# Patient Record
Sex: Male | Born: 1985 | Race: White | Hispanic: No | Marital: Married | Smoking: Current every day smoker
Health system: Southern US, Community
[De-identification: ages and names within clinical notes are randomized; demographics above are authoritative.]

## PROBLEM LIST (undated history)

## (undated) DIAGNOSIS — S27309A Unspecified injury of lung, unspecified, initial encounter: Secondary | ICD-10-CM

---

## 2004-01-17 ENCOUNTER — Emergency Department (HOSPITAL_COMMUNITY): Admission: EM | Admit: 2004-01-17 | Discharge: 2004-01-17 | Payer: Self-pay | Admitting: Emergency Medicine

## 2005-11-24 ENCOUNTER — Emergency Department (HOSPITAL_COMMUNITY): Admission: EM | Admit: 2005-11-24 | Discharge: 2005-11-24 | Payer: Self-pay | Admitting: Emergency Medicine

## 2014-09-10 ENCOUNTER — Emergency Department (HOSPITAL_BASED_OUTPATIENT_CLINIC_OR_DEPARTMENT_OTHER)
Admission: EM | Admit: 2014-09-10 | Discharge: 2014-09-10 | Disposition: A | Discharge status: Home / Self Care | Payer: Self-pay | Attending: Emergency Medicine | Admitting: Emergency Medicine

## 2014-09-10 ENCOUNTER — Encounter (HOSPITAL_BASED_OUTPATIENT_CLINIC_OR_DEPARTMENT_OTHER): Payer: Self-pay | Admitting: Emergency Medicine

## 2014-09-10 ENCOUNTER — Emergency Department (HOSPITAL_BASED_OUTPATIENT_CLINIC_OR_DEPARTMENT_OTHER): Payer: Self-pay

## 2014-09-10 DIAGNOSIS — S0990XA Unspecified injury of head, initial encounter: Secondary | ICD-10-CM

## 2014-09-10 DIAGNOSIS — Z720 Tobacco use: Secondary | ICD-10-CM | POA: Insufficient documentation

## 2014-09-10 DIAGNOSIS — Y9289 Other specified places as the place of occurrence of the external cause: Secondary | ICD-10-CM | POA: Insufficient documentation

## 2014-09-10 DIAGNOSIS — W01198A Fall on same level from slipping, tripping and stumbling with subsequent striking against other object, initial encounter: Secondary | ICD-10-CM | POA: Insufficient documentation

## 2014-09-10 DIAGNOSIS — S060X1A Concussion with loss of consciousness of 30 minutes or less, initial encounter: Secondary | ICD-10-CM | POA: Insufficient documentation

## 2014-09-10 DIAGNOSIS — T148XXA Other injury of unspecified body region, initial encounter: Secondary | ICD-10-CM

## 2014-09-10 DIAGNOSIS — Y9389 Activity, other specified: Secondary | ICD-10-CM | POA: Insufficient documentation

## 2014-09-10 DIAGNOSIS — W19XXXA Unspecified fall, initial encounter: Secondary | ICD-10-CM

## 2014-09-10 DIAGNOSIS — S0512XA Contusion of eyeball and orbital tissues, left eye, initial encounter: Secondary | ICD-10-CM | POA: Insufficient documentation

## 2014-09-10 DIAGNOSIS — S199XXA Unspecified injury of neck, initial encounter: Secondary | ICD-10-CM | POA: Insufficient documentation

## 2014-09-10 MED ORDER — PROMETHAZINE HCL 25 MG PO TABS: 25.0000 mg | ORAL_TABLET | Freq: Four times a day (QID) | ORAL | Status: DC | PRN

## 2014-09-10 MED ORDER — NAPROXEN 500 MG PO TABS: 500.0000 mg | ORAL_TABLET | Freq: Two times a day (BID) | ORAL | Status: DC

## 2014-09-10 NOTE — ED Notes (Signed)
[place]. States he thinks he hit left side of head to ledge of tub. Bruising noted to left eye and temple. C/o neck pain and h/a. Pt states he had had a few shots of alcohol on Saturday. States he does not remember passing out. States he had a sorethroat and chills prior to passing out.

## 2014-09-10 NOTE — Discharge Instructions (Signed)
Concussion °A concussion is a brain injury. It is caused by: °· A hit to the head. °· A quick and sudden movement (jolt) of the head or neck. °A concussion is usually not life threatening. Even so, it can cause serious problems. If you had a concussion before, you may have concussion-like problems after a hit to your head. °HOME CARE °General Instructions °· Follow your doctor's directions carefully. °· Take medicines only as told by your doctor. °· Only take medicines your doctor says are safe. °· Do not drink alcohol until your doctor says it is okay. Alcohol and some drugs can slow down healing. They can also put you at risk for further injury. °· If you are having trouble remembering things, write them down. °· Try to do one thing at a time if you get distracted easily. For example, do not watch TV while making dinner. °· Talk to your family members or close friends when making important decisions. °· Follow up with your doctor as told. °· Watch your symptoms. Tell others to do the same. Serious problems can sometimes happen after a concussion. Older adults are more likely to have these problems. °· Tell your teachers, school nurse, school counselor, coach, athletic trainer, or work manager about your concussion. Tell them about what you can or cannot do. They should watch to see if: °¨ It gets even harder for you to pay attention or concentrate. °¨ It gets even harder for you to remember things or learn new things. °¨ You need more time than normal to finish things. °¨ You become annoyed (irritable) more than before. °¨ You are not able to deal with stress as well. °¨ You have more problems than before. °· Rest. Make sure you: °¨ Get plenty of sleep at night. °¨ Go to sleep early. °¨ Go to bed at the same time every day. Try to wake up at the same time. °¨ Rest during the day. °¨ Take naps when you feel tired. °· Limit activities where you have to think a lot or concentrate. These include: °¨ Doing  homework. °¨ Doing work related to a job. °¨ Watching TV. °¨ Using the computer. °Returning To Your Regular Activities °Return to your normal activities slowly, not all at once. You must give your body and brain enough time to heal.  °· Do not play sports or do other athletic activities until your doctor says it is okay. °· Ask your doctor when you can drive, ride a bicycle, or work other vehicles or machines. Never do these things if you feel dizzy. °· Ask your doctor about when you can return to work or school. °Preventing Another Concussion °It is very important to avoid another brain injury, especially before you have healed. In rare cases, another injury can lead to permanent brain damage, brain swelling, or death. The risk of this is greatest during the first 7-10 days after your injury. Avoid injuries by:  °· Wearing a seat belt when riding in a car. °· Not drinking too much alcohol. °· Avoiding activities that could lead to a second concussion (such as contact sports). °· Wearing a helmet when doing activities like: °¨ Biking. °¨ Skiing. °¨ Skateboarding. °¨ Skating. °· Making your home safer by: °¨ Removing things from the floor or stairways that could make you trip. °¨ Using grab bars in bathrooms and handrails by stairs. °¨ Placing non-slip mats on floors and in bathtubs. °¨ Improve lighting in dark areas. °GET HELP IF: °· It   gets even harder for you to pay attention or concentrate.  It gets even harder for you to remember things or learn new things.  You need more time than normal to finish things.  You become annoyed (irritable) more than before.  You are not able to deal with stress as well.  You have more problems than before.  You have problems keeping your balance.  You are not able to react quickly when you should. Get help if you have any of these problems for more than 2 weeks:   Lasting (chronic) headaches.  Dizziness or trouble balancing.  Feeling sick to your stomach  (nausea).  Seeing (vision) problems.  Being affected by noises or light more than normal.  Feeling sad, low, down in the dumps, blue, gloomy, or empty (depressed).  Mood changes (mood swings).  Feeling of fear or nervousness about what may happen (anxiety).  Feeling annoyed.  Memory problems.  Problems concentrating or paying attention.  Sleep problems.  Feeling tired all the time. GET HELP RIGHT AWAY IF:   You have bad headaches or your headaches get worse.  You have weakness (even if it is in one hand, leg, or part of the face).  You have loss of feeling (numbness).  You feel off balance.  You keep throwing up (vomiting).  You feel tired.  One black center of your eye (pupil) is larger than the other.  You twitch or shake violently (convulse).  Your speech is not clear (slurred).  You are more confused, easily angered (agitated), or annoyed than before.  You have more trouble resting than before.  You are unable to recognize people or places.  You have neck pain.  It is difficult to wake you up.  You have unusual behavior changes.  You pass out (lose consciousness). MAKE SURE YOU:   Understand these instructions.  Will watch your condition.  Will get help right away if you are not doing well or get worse. Document Released: 10/20/2009 Document Revised: 03/18/2014 Document Reviewed: 05/24/2013 [facility] Patient Information 2015 [place], [place]. This information is not intended to replace advice given to you by your health care provider. Make sure you discuss any questions you have with your health care provider.   CK and of the head face and neck without any significant abnormalities. Symptoms consistent with concussion. Take medications as directed. Work note provided. Return for any new or worse symptoms.

## 2014-09-10 NOTE — ED Provider Notes (Signed)
CSN: [id number]     Arrival date & time 09/10/14  [id number] History   First MD Initiated Contact with Patient 09/10/14 1002     Chief Complaint  Patient presents with  . Fall  . Headache     (Consider location/radiation/quality/duration/timing/severity/associated sxs/prior Treatment) Patient is a 28 y.o. male presenting with fall and headaches. The history is provided by the patient.  Fall Pertinent negatives include no chest pain, no abdominal pain, no headaches and no shortness of breath.  Headache Associated symptoms: nausea and neck pain   Associated symptoms: no abdominal pain, no back pain, no congestion, no fever and no vomiting    patient with a fall passing out in the bathroom on Saturday. Thinks he hit the left side of his head. He has bruising in that area. And bruising around his left eye. Patient also complained of some mild left-sided neck pain. Has headache. Has a little bit of mild nausea. Denies any other injuries. Patient does not remember passing out.  History reviewed. No pertinent past medical history. No past surgical history on file. No family history on file. History  Substance Use Topics  . Smoking status: Current Every Day Smoker -- 0.50 packs/day    Types: Cigarettes  . Smokeless tobacco: Not on file  . Alcohol Use: Not on file    Review of Systems  Constitutional: Negative for fever.  HENT: Negative for congestion.   Eyes: Negative for visual disturbance.  Respiratory: Negative for shortness of breath.   Cardiovascular: Negative for chest pain.  Gastrointestinal: Positive for nausea. Negative for vomiting and abdominal pain.  Genitourinary: Negative for hematuria.  Musculoskeletal: Positive for neck pain. Negative for back pain.  Skin: Positive for wound.  Neurological: Negative for headaches.  Hematological: Does not bruise/bleed easily.  Psychiatric/Behavioral: Negative for confusion.      Allergies  Review of patient's allergies indicates no  known allergies.  Home Medications   Prior to Admission medications   Medication Sig Start Date End Date Taking? Authorizing Provider  naproxen (NAPROSYN) 500 MG tablet Take 1 tablet (500 mg total) by mouth 2 (two) times daily. 09/10/14   Vanetta MuldersScott Nasira Janusz, MD  promethazine (PHENERGAN) 25 MG tablet Take 1 tablet (25 mg total) by mouth every 6 (six) hours as needed for nausea or vomiting. 09/10/14   Vanetta MuldersScott Tomasina Keasling, MD   BP 126/78  Pulse 74  Temp(Src) 98.2 F (36.8 C) (Oral)  Resp 18  Ht 6\' 1"  (1.854 m)  Wt 215 lb (97.523 kg)  BMI 28.37 kg/m2  SpO2 97% Physical Exam  Nursing note and vitals reviewed. Constitutional: He is oriented to person, place, and time. He appears well-developed and well-nourished. No distress.  HENT:  Head: Normocephalic and atraumatic.  Except for swelling around the left eye with contusion. Extraocular muscles intact. Upper lid with laceration is closed and healing.  Eyes: Conjunctivae and EOM are normal. Pupils are equal, round, and reactive to light.  Neck: Normal range of motion. Neck supple.  Cardiovascular: Normal rate, regular rhythm and normal heart sounds.   No murmur heard. Pulmonary/Chest: Effort normal and breath sounds normal. No respiratory distress.  Abdominal: Soft. Bowel sounds are normal. There is no tenderness.  Musculoskeletal: Normal range of motion. He exhibits no edema.  Neurological: He is alert and oriented to person, place, and time. No cranial nerve deficit. He exhibits normal muscle tone. Coordination normal.  Skin: Skin is warm.    ED Course  Procedures (including critical care time) Labs Review Labs Reviewed -  No data to display  Imaging Review Ct Head Wo Contrast  09/10/2014   CLINICAL DATA:  Fall, passed out in the bathroom, left eye and left temporal bruising  EXAM: CT HEAD WITHOUT CONTRAST  CT MAXILLOFACIAL WITHOUT CONTRAST  CT CERVICAL SPINE WITHOUT CONTRAST  TECHNIQUE: Multidetector CT imaging of the head, cervical  spine, and maxillofacial structures were performed using the standard protocol without intravenous contrast. Multiplanar CT image reconstructions of the cervical spine and maxillofacial structures were also generated.  COMPARISON:  None.  FINDINGS: CT HEAD FINDINGS  No skull fracture is noted. Paranasal sinuses and mastoid air cells are unremarkable.  No intracranial hemorrhage, mass effect or midline shift. No hydrocephalus. No acute infarction. No mass lesion is noted on this unenhanced scan. No intra or extra-axial fluid collection. There is mild soft tissue swelling left supraorbital an left temporal region.  CT MAXILLOFACIAL FINDINGS  Axial images shows no facial fractures. No facial fluid collection. Mild left periorbital soft tissue swelling. Mild left supraorbital soft tissue swelling. No maxillary sinuses air-fluid levels.  Coronal images shows right deviation of nasal bony septum. Bilateral semilunar canal is patent. No orbital rim or orbital floor fracture. Bilateral eye globe is symmetrical in appearance. No intraorbital hematoma.  No nasal bone fracture is identified.  Sagittal images shows no maxillary spine fracture. The nasopharyngeal and oropharyngeal airway is patent. There is prominent retropharyngeal soft tissue probable due to adenoids.  There is no mandibular fracture.  No TMJ dislocation.  No zygomatic fracture is noted.  CT CERVICAL SPINE FINDINGS  Axial images of the cervical spine shows no acute fracture or subluxation. Minimal disc space flattening with mild posterior spurring at C4-C5 level. Mild disc space flattening with mild posterior spurring at C5-C6 level. No prevertebral soft tissue swelling.  Computer processed images shows no acute fracture or subluxation. Cervical airway is patent. There is no pneumothorax in visualized lung apices.  IMPRESSION: 1. No acute intracranial abnormality. Mild soft tissue swelling left supraorbital region. 2. There is mild deviation to the right of  bony nasal septum. 3. No mandibular fracture.  No zygomatic fracture. 4. No orbital rim or orbital floor fracture. Mild soft tissue swelling left periorbital and left temporal region. 5. Patent nasopharyngeal and oropharyngeal airway. Mild prominent retropharyngeal soft tissue suspicious for adenoids. 6. No cervical spine acute fracture or subluxation. Mild degenerative changes as described above.   Electronically Signed   By: Natasha Mead M.D.   On: 09/10/2014 11:31   Ct Cervical Spine Wo Contrast  09/10/2014   CLINICAL DATA:  Fall, passed out in the bathroom, left eye and left temporal bruising  EXAM: CT HEAD WITHOUT CONTRAST  CT MAXILLOFACIAL WITHOUT CONTRAST  CT CERVICAL SPINE WITHOUT CONTRAST  TECHNIQUE: Multidetector CT imaging of the head, cervical spine, and maxillofacial structures were performed using the standard protocol without intravenous contrast. Multiplanar CT image reconstructions of the cervical spine and maxillofacial structures were also generated.  COMPARISON:  None.  FINDINGS: CT HEAD FINDINGS  No skull fracture is noted. Paranasal sinuses and mastoid air cells are unremarkable.  No intracranial hemorrhage, mass effect or midline shift. No hydrocephalus. No acute infarction. No mass lesion is noted on this unenhanced scan. No intra or extra-axial fluid collection. There is mild soft tissue swelling left supraorbital an left temporal region.  CT MAXILLOFACIAL FINDINGS  Axial images shows no facial fractures. No facial fluid collection. Mild left periorbital soft tissue swelling. Mild left supraorbital soft tissue swelling. No maxillary sinuses air-fluid levels.  Coronal images shows right deviation of nasal bony septum. Bilateral semilunar canal is patent. No orbital rim or orbital floor fracture. Bilateral eye globe is symmetrical in appearance. No intraorbital hematoma.  No nasal bone fracture is identified.  Sagittal images shows no maxillary spine fracture. The nasopharyngeal and  oropharyngeal airway is patent. There is prominent retropharyngeal soft tissue probable due to adenoids.  There is no mandibular fracture.  No TMJ dislocation.  No zygomatic fracture is noted.  CT CERVICAL SPINE FINDINGS  Axial images of the cervical spine shows no acute fracture or subluxation. Minimal disc space flattening with mild posterior spurring at C4-C5 level. Mild disc space flattening with mild posterior spurring at C5-C6 level. No prevertebral soft tissue swelling.  Computer processed images shows no acute fracture or subluxation. Cervical airway is patent. There is no pneumothorax in visualized lung apices.  IMPRESSION: 1. No acute intracranial abnormality. Mild soft tissue swelling left supraorbital region. 2. There is mild deviation to the right of bony nasal septum. 3. No mandibular fracture.  No zygomatic fracture. 4. No orbital rim or orbital floor fracture. Mild soft tissue swelling left periorbital and left temporal region. 5. Patent nasopharyngeal and oropharyngeal airway. Mild prominent retropharyngeal soft tissue suspicious for adenoids. 6. No cervical spine acute fracture or subluxation. Mild degenerative changes as described above.   Electronically Signed   By: Natasha Mead M.D.   On: 09/10/2014 11:31   Ct Maxillofacial Wo Cm  09/10/2014   CLINICAL DATA:  Fall, passed out in the bathroom, left eye and left temporal bruising  EXAM: CT HEAD WITHOUT CONTRAST  CT MAXILLOFACIAL WITHOUT CONTRAST  CT CERVICAL SPINE WITHOUT CONTRAST  TECHNIQUE: Multidetector CT imaging of the head, cervical spine, and maxillofacial structures were performed using the standard protocol without intravenous contrast. Multiplanar CT image reconstructions of the cervical spine and maxillofacial structures were also generated.  COMPARISON:  None.  FINDINGS: CT HEAD FINDINGS  No skull fracture is noted. Paranasal sinuses and mastoid air cells are unremarkable.  No intracranial hemorrhage, mass effect or midline shift. No  hydrocephalus. No acute infarction. No mass lesion is noted on this unenhanced scan. No intra or extra-axial fluid collection. There is mild soft tissue swelling left supraorbital an left temporal region.  CT MAXILLOFACIAL FINDINGS  Axial images shows no facial fractures. No facial fluid collection. Mild left periorbital soft tissue swelling. Mild left supraorbital soft tissue swelling. No maxillary sinuses air-fluid levels.  Coronal images shows right deviation of nasal bony septum. Bilateral semilunar canal is patent. No orbital rim or orbital floor fracture. Bilateral eye globe is symmetrical in appearance. No intraorbital hematoma.  No nasal bone fracture is identified.  Sagittal images shows no maxillary spine fracture. The nasopharyngeal and oropharyngeal airway is patent. There is prominent retropharyngeal soft tissue probable due to adenoids.  There is no mandibular fracture.  No TMJ dislocation.  No zygomatic fracture is noted.  CT CERVICAL SPINE FINDINGS  Axial images of the cervical spine shows no acute fracture or subluxation. Minimal disc space flattening with mild posterior spurring at C4-C5 level. Mild disc space flattening with mild posterior spurring at C5-C6 level. No prevertebral soft tissue swelling.  Computer processed images shows no acute fracture or subluxation. Cervical airway is patent. There is no pneumothorax in visualized lung apices.  IMPRESSION: 1. No acute intracranial abnormality. Mild soft tissue swelling left supraorbital region. 2. There is mild deviation to the right of bony nasal septum. 3. No mandibular fracture.  No zygomatic fracture. 4.  No orbital rim or orbital floor fracture. Mild soft tissue swelling left periorbital and left temporal region. 5. Patent nasopharyngeal and oropharyngeal airway. Mild prominent retropharyngeal soft tissue suspicious for adenoids. 6. No cervical spine acute fracture or subluxation. Mild degenerative changes as described above.   Electronically  Signed   By: Natasha MeadLiviu  Pop M.D.   On: 09/10/2014 11:31     EKG Interpretation None      MDM   Final diagnoses:  Fall  Head injury, initial encounter  Concussion, with loss of consciousness of 30 minutes or less, initial encounter  Contusion    Patient with a fall on Saturday. Patient was concerned due to having bruising around the left eye and facial pain and headache and some nausea. Also some mild left-sided neck pain.  Workup here for the fall negative. Patient clinically has symptoms consistent with concussion. CT head neck and face all negative. Except for some mild deviation of the nose.  Patient reassured. Patient be treated with Naprosyn and antinausea medicine. Work note provided.    Vanetta MuldersScott Stepfanie Yott, MD 09/10/14 1228

## 2014-09-10 NOTE — ED Notes (Signed)
Pt and wife noted walking out EMS doors. Informed that they won't be able to get back in if they leave by those doors, asked if I can help them. Pt and wife state they are leaving. Pt states he feels he needs a stronger pain medication than dr. Deretha Emoryzackowski is prescribing. Pt and wife asked to wait one moment while I speak with dr. Deretha EmoryZackowski, md is in office printing off d/c instructions, work note, and prescriptions. All given to pt, pt and wife verbalize understanding of all d/c instructions and prescriptions, all questions answered. Pt refuses d/c vital signs, declines w/c, d/c amb with wife.

## 2017-10-03 ENCOUNTER — Other Ambulatory Visit: Payer: Self-pay

## 2017-10-03 ENCOUNTER — Encounter (HOSPITAL_BASED_OUTPATIENT_CLINIC_OR_DEPARTMENT_OTHER): Payer: Self-pay | Admitting: *Deleted

## 2017-10-03 ENCOUNTER — Emergency Department (HOSPITAL_BASED_OUTPATIENT_CLINIC_OR_DEPARTMENT_OTHER)
Admission: EM | Admit: 2017-10-03 | Discharge: 2017-10-03 | Disposition: A | Discharge status: Home / Self Care | Payer: Self-pay | Attending: Emergency Medicine | Admitting: Emergency Medicine

## 2017-10-03 DIAGNOSIS — F0781 Postconcussional syndrome: Secondary | ICD-10-CM | POA: Insufficient documentation

## 2017-10-03 DIAGNOSIS — R002 Palpitations: Secondary | ICD-10-CM | POA: Insufficient documentation

## 2017-10-03 DIAGNOSIS — F1290 Cannabis use, unspecified, uncomplicated: Secondary | ICD-10-CM | POA: Insufficient documentation

## 2017-10-03 DIAGNOSIS — Z79899 Other long term (current) drug therapy: Secondary | ICD-10-CM | POA: Insufficient documentation

## 2017-10-03 LAB — URINALYSIS, ROUTINE W REFLEX MICROSCOPIC
BILIRUBIN URINE: NEGATIVE
Glucose, UA: NEGATIVE mg/dL
Hgb urine dipstick: NEGATIVE
KETONES UR: NEGATIVE mg/dL
LEUKOCYTES UA: NEGATIVE
NITRITE: NEGATIVE
PH: 6 (ref 5.0–8.0)
PROTEIN: NEGATIVE mg/dL
Specific Gravity, Urine: 1.02 (ref 1.005–1.030)

## 2017-10-03 LAB — CBC WITH DIFFERENTIAL/PLATELET
BASOS ABS: 0 10*3/uL (ref 0.0–0.1)
Basophils Relative: 0 %
EOS PCT: 1 %
Eosinophils Absolute: 0.1 10*3/uL (ref 0.0–0.7)
HEMATOCRIT: 44.9 % (ref 39.0–52.0)
Hemoglobin: 14.9 g/dL (ref 13.0–17.0)
LYMPHS ABS: 1.7 10*3/uL (ref 0.7–4.0)
LYMPHS PCT: 23 %
MCH: 31 pg (ref 26.0–34.0)
MCHC: 33.2 g/dL (ref 30.0–36.0)
MCV: 93.5 fL (ref 78.0–100.0)
MONO ABS: 0.6 10*3/uL (ref 0.1–1.0)
Monocytes Relative: 8 %
NEUTROS ABS: 5.1 10*3/uL (ref 1.7–7.7)
Neutrophils Relative %: 68 %
PLATELETS: 171 10*3/uL (ref 150–400)
RBC: 4.8 MIL/uL (ref 4.22–5.81)
RDW: 13.1 % (ref 11.5–15.5)
WBC: 7.6 10*3/uL (ref 4.0–10.5)

## 2017-10-03 LAB — RAPID URINE DRUG SCREEN, HOSP PERFORMED
Amphetamines: NOT DETECTED
BARBITURATES: NOT DETECTED
BENZODIAZEPINES: NOT DETECTED
COCAINE: NOT DETECTED
Opiates: NOT DETECTED
Tetrahydrocannabinol: POSITIVE — AB

## 2017-10-03 LAB — BASIC METABOLIC PANEL
Anion gap: 5 (ref 5–15)
BUN: 18 mg/dL (ref 6–20)
CALCIUM: 9 mg/dL (ref 8.9–10.3)
CO2: 27 mmol/L (ref 22–32)
CREATININE: 1.14 mg/dL (ref 0.61–1.24)
Chloride: 103 mmol/L (ref 101–111)
GFR calc non Af Amer: >60 mL/min (ref >60)
Glucose, Bld: 109 mg/dL — ABNORMAL HIGH (ref 65–99)
Potassium: 3.5 mmol/L (ref 3.5–5.1)
Sodium: 135 mmol/L (ref 135–145)

## 2017-10-03 NOTE — ED Provider Notes (Signed)
[facility] EMERGENCY DEPARTMENT Provider Note  CSN: [id number] Arrival date & time: 10/03/17 1801  Chief Complaint(s) Muscle Pain (multiple complaints)  HPI Maudry Diegoatrick M Esters is a 31 y.o. male who presents with several days of intermittent confusion, fatigue.  The symptoms started the day after he had a syncopal episode resulting in head trauma and loss of consciousness.  He reports that the syncopal episode occurred while at a grocery store.  States that he was feeling palpitations and felt like getting fresh air.  When he turned around he feels like he stumbled, and fell.  He did report feeling the world closing in on him and feeling lightheaded just prior to passing out.  His partner reported that he was out for approximately 1-2 minutes.  Following the fall the patient with confused and slow to respond.  He has slowly improved since.  He has not had any focal weakness.  He denied any chest pain, shortness of breath.  No nausea, vomiting, diarrhea, abdominal pain.  Patient did report having flulike symptoms 2 weeks ago for 1-2 days which resolved spontaneously.  The history is provided by the patient.    Past Medical History History reviewed. No pertinent past medical history. There are no active problems to display for this patient.  Home Medication(s) Prior to Admission medications   Medication Sig Start Date End Date Taking? Authorizing Provider  naproxen (NAPROSYN) 500 MG tablet Take 1 tablet (500 mg total) by mouth 2 (two) times daily. 09/10/14   Vanetta MuldersZackowski, Scott, MD  promethazine (PHENERGAN) 25 MG tablet Take 1 tablet (25 mg total) by mouth every 6 (six) hours as needed for nausea or vomiting. 09/10/14   Vanetta MuldersZackowski, Scott, MD                                                                                                                                    Past Surgical History History reviewed. No pertinent surgical history. Family History No family history on  file.  Social History Social History   Tobacco Use  . Smoking status: Current Every Day Smoker    Packs/day: 0.50    Types: Cigarettes  . Smokeless tobacco: Never Used  Substance Use Topics  . Alcohol use: No    Frequency: Never  . Drug use: No   Allergies Patient has no known allergies.  Review of Systems Review of Systems All other systems are reviewed and are negative for acute change except as noted in the HPI  Physical Exam Vital Signs  I have reviewed the triage vital signs BP 121/74 (BP Location: Right Arm)   Pulse 65   Temp 97.7 F (36.5 C) (Oral)   Resp 18   Ht 6\' 1"  (1.854 m)   Wt 102.1 kg (225 lb)   SpO2 98%   BMI 29.69 kg/m   Physical Exam  Constitutional: He is oriented to person, place, and time. He appears well-developed and  well-nourished. No distress.  HENT:  Head: Normocephalic and atraumatic.  Nose: Nose normal.  Eyes: Conjunctivae and EOM are normal. Pupils are equal, round, and reactive to light. Right eye exhibits no discharge. Left eye exhibits no discharge. No scleral icterus.  Neck: Normal range of motion. Neck supple.  Cardiovascular: Normal rate and regular rhythm. Exam reveals no gallop and no friction rub.  No murmur heard. Pulmonary/Chest: Effort normal and breath sounds normal. No stridor. No respiratory distress. He has no rales.  Abdominal: Soft. He exhibits no distension. There is no tenderness.  Musculoskeletal: He exhibits no edema or tenderness.  Neurological: He is alert and oriented to person, place, and time.  Mental Status:  Alert and oriented to person, place, and time.  Attention and concentration normal.  Speech clear.  Recent memory is intact  Cranial Nerves:  II Visual Fields: Intact to confrontation. Visual fields intact. III, IV, VI: Pupils equal and reactive to light and near. Full eye movement without nystagmus  V Facial Sensation: Normal. No weakness of masticatory muscles  VII: No facial weakness or  asymmetry  VIII Auditory Acuity: Grossly normal  IX/X: The uvula is midline; the palate elevates symmetrically  XI: Normal sternocleidomastoid and trapezius strength  XII: The tongue is midline. No atrophy or fasciculations.   Motor System: Muscle Strength: 5/5 and symmetric in the upper and lower extremities. No pronation or drift.  Muscle Tone: Tone and muscle bulk are normal in the upper and lower extremities.   Reflexes: DTRs: 1+ and symmetrical in all four extremities. No Clonus Coordination: Intact finger-to-nose, heel-to-shin. No tremor.  Sensation: Intact to light touch, and pinprick. Negative Romberg test.  Gait: Routine and tandem gait normal.   Skin: Skin is warm and dry. No rash noted. He is not diaphoretic. No erythema.  Psychiatric: He has a normal mood and affect.  Vitals reviewed.   ED Results and Treatments Labs (all labs ordered are listed, but only abnormal results are displayed) Labs Reviewed  RAPID URINE DRUG SCREEN, HOSP PERFORMED - Abnormal; Notable for the following components:      Result Value   Tetrahydrocannabinol POSITIVE (*)    All other components within normal limits  BASIC METABOLIC PANEL - Abnormal; Notable for the following components:   Glucose, Bld 109 (*)    All other components within normal limits  URINALYSIS, ROUTINE W REFLEX MICROSCOPIC  CBC WITH DIFFERENTIAL/PLATELET                                                                                                                         EKG  EKG Interpretation  Date/Time:  Monday October 03 2017 22:21:28 EST Ventricular Rate:  60 PR Interval:    QRS Duration: 108 QT Interval:  392 QTC Calculation: 392 R Axis:   43 Text Interpretation:  Sinus rhythm ST elev, probable normal early repol pattern No significant change since last tracing Confirmed by Drema Pry [phone]) on 10/04/2017 12:10:51 AM  Radiology No results found. Pertinent labs & imaging results that were available  during my care of the patient were reviewed by me and considered in my medical decision making (see chart for details).  Medications Ordered in ED Medications - No data to display                                                                                                                                  Procedures Procedures  (including critical care time)  Medical Decision Making / ED Course I have reviewed the nursing notes for this encounter and the patient's prior records (if available in EHR or on provided paperwork).    Patient is afebrile with stable vital signs, well-appearing, well-hydrated, nontoxic.  Labs grossly reassuring.  [facility] which patient endorsed using prior to his syncopal episode.  EKG without evidence of acute ischemia, dysrhythmias, blocks, Brugada, epsilon waves, evidence of HOCM.  Patient does seem to have symptoms of postconcussive syndrome.  Neuro exam was nonfocal.  Have low suspicion for ICH.  Do not feel that advanced imaging is warranted at this time.  Recommended follow-up with concussion specialist and recommended he establish care with a primary care provider.  The patient appears reasonably screened and/or stabilized for discharge and I doubt any other medical condition or other [facility] requiring further screening, evaluation, or treatment in the ED at this time prior to discharge.  The patient is safe for discharge with strict return precautions.   Final Clinical Impression(s) / ED Diagnoses Final diagnoses:  Palpitations  Post concussion syndrome   Disposition: Discharge  Condition: Good  I have discussed the results, Dx and Tx plan with the patient who expressed understanding and agree(s) with the plan. Discharge instructions discussed at great length. The patient was given strict return precautions who verbalized understanding of the instructions. No further questions at time of discharge.    ED Discharge Orders    None        Follow Up: Lenda KelpHudnall, Shane R, MD [address] [place] [id number] [phone]  Call  For close follow up to assess for post concussive syndrome   This chart was dictated using voice recognition software.  Despite best efforts to proofread,  errors can occur which can change the documentation meaning.   Nira Connardama, Pedro Eduardo, MD 10/04/17 [phone]

## 2017-10-03 NOTE — ED Notes (Signed)
Pt seen leaving the ED waiting room to the parking lot

## 2017-10-03 NOTE — ED Notes (Signed)
Pt seen again walking out of the department, smoking

## 2017-10-03 NOTE — ED Triage Notes (Signed)
2 weeks of muscle cramps. 3 days ago he fell and hit his head on the concrete floor after he had a rapid heart beat and dizziness. Possible LOC. He was not seen at that time. Hx of anxiety. Event could have been anxiety related per pt. The next day he couldn't remember but his memory improved as the day progressed. Yesterday he had an episode of heart beating fast. Today his muscles cramp and he feels fatigue. He is in no distress at time of triage.

## 2017-10-03 NOTE — Discharge Instructions (Signed)
If you do not have a primary care physician then it is very important that you develop a relationship with one.  Please contact [facility] at [phone] for a referral to many excellent primary care physicians in the community.   ° ° °

## 2017-10-03 NOTE — ED Notes (Signed)
ED Provider at bedside. 

## 2018-10-24 ENCOUNTER — Other Ambulatory Visit: Payer: Self-pay

## 2018-10-24 ENCOUNTER — Encounter (HOSPITAL_BASED_OUTPATIENT_CLINIC_OR_DEPARTMENT_OTHER): Payer: Self-pay

## 2018-10-24 ENCOUNTER — Emergency Department (HOSPITAL_BASED_OUTPATIENT_CLINIC_OR_DEPARTMENT_OTHER)
Admission: EM | Admit: 2018-10-24 | Discharge: 2018-10-25 | Disposition: A | Discharge status: Home / Self Care | Payer: Self-pay | Attending: Emergency Medicine | Admitting: Emergency Medicine

## 2018-10-24 DIAGNOSIS — K0889 Other specified disorders of teeth and supporting structures: Secondary | ICD-10-CM | POA: Insufficient documentation

## 2018-10-24 DIAGNOSIS — Z79899 Other long term (current) drug therapy: Secondary | ICD-10-CM | POA: Insufficient documentation

## 2018-10-24 DIAGNOSIS — F17210 Nicotine dependence, cigarettes, uncomplicated: Secondary | ICD-10-CM | POA: Insufficient documentation

## 2018-10-24 MED ORDER — PENICILLIN V POTASSIUM 250 MG PO TABS
500.0000 mg | ORAL_TABLET | Freq: Once | ORAL | Status: AC
  Administered 2018-10-24: 500 mg via ORAL
  Filled 2018-10-24: qty 2

## 2018-10-24 MED ORDER — PENICILLIN V POTASSIUM 500 MG PO TABS: 500.0000 mg | ORAL_TABLET | Freq: Four times a day (QID) | ORAL | 0 refills | Status: AC

## 2018-10-24 MED ORDER — HYDROCODONE-ACETAMINOPHEN 5-325 MG PO TABS
1.0000 | ORAL_TABLET | Freq: Once | ORAL | Status: AC
  Administered 2018-10-24: 1 via ORAL
  Filled 2018-10-24: qty 1

## 2018-10-24 NOTE — ED Provider Notes (Signed)
[facility] EMERGENCY DEPARTMENT Provider Note   CSN: [id number] Arrival date & time: 10/24/18  2226     History   Chief Complaint Chief Complaint  Patient presents with  . Dental Pain    HPI Andrew Gardner is a 32 y.o. male.  The history is provided by the patient and medical records. No language interpreter was used.  Dental Pain      Andrew Gardner is a 32 y.o. male who presents to the Emergency Department complaining of persistent, gradually worsening, right-sided, lower dental pain beginning one week ago. Pt describes their pain as throbbing. Pt has been taking ibuprofen, tylenol and BC powder at home with minimal relief of pain. Called to schedule a dentist appointment, but its not for a few days. This morning, he noticed facial swelling to the right jaw. Pt denies fever, chills, difficulty breathing, difficulty swallowing.   History reviewed. No pertinent past medical history.  There are no active problems to display for this patient.   History reviewed. No pertinent surgical history.      Home Medications    Prior to Admission medications   Medication Sig Start Date End Date Taking? Authorizing Provider  naproxen (NAPROSYN) 500 MG tablet Take 1 tablet (500 mg total) by mouth 2 (two) times daily. 09/10/14   Vanetta Mulders, MD  penicillin v potassium (VEETID) 500 MG tablet Take 1 tablet (500 mg total) by mouth 4 (four) times daily for 7 days. 10/24/18 10/31/18  Renold Kozar, Chase Picket, PA-C  promethazine (PHENERGAN) 25 MG tablet Take 1 tablet (25 mg total) by mouth every 6 (six) hours as needed for nausea or vomiting. 09/10/14   Vanetta Mulders, MD    Family History No family history on file.  Social History Social History   Tobacco Use  . Smoking status: Current Every Day Smoker    Packs/day: 0.50    Types: Cigarettes  . Smokeless tobacco: Never Used  Substance Use Topics  . Alcohol use: No    Frequency: Never  . Drug use: Yes    Types:  Marijuana     Allergies   Patient has no known allergies.   Review of Systems Review of Systems  Constitutional: Negative for chills and fever.  HENT: Positive for dental problem. Negative for sore throat.   Respiratory: Negative for shortness of breath.      Physical Exam Updated Vital Signs BP 124/78 (BP Location: Left Arm)   Pulse 70   Temp 98.3 F (36.8 C) (Oral)   Resp 18   Ht 6\' 1"  (1.854 m)   Wt 90.7 kg   SpO2 100%   BMI 26.39 kg/m   Physical Exam  Constitutional: He appears well-developed and well-nourished. No distress.  HENT:  Head: Normocephalic and atraumatic.  Mouth/Throat:    Dental cavities and poor oral dentition noted. Pain along tooth as depicted in image. No abscess noted. Midline uvula. No trismus. OP moist and clear. No oropharyngeal erythema or edema. Neck supple with no tenderness. Mild right-sided facial edema.  Neck: Neck supple.  Cardiovascular: Normal rate, regular rhythm and normal heart sounds.  No murmur heard. Pulmonary/Chest: Effort normal and breath sounds normal. No respiratory distress. He has no wheezes. He has no rales.  Musculoskeletal: Normal range of motion.  Neurological: He is alert.  Skin: Skin is warm and dry.  Nursing note and vitals reviewed.    ED Treatments / Results  Labs (all labs ordered are listed, but only abnormal results are displayed)  Labs Reviewed - No data to display  EKG None  Radiology No results found.  Procedures Procedures (including critical care time)  Medications Ordered in ED Medications  HYDROcodone-acetaminophen (NORCO/VICODIN) 5-325 MG per tablet 1 tablet (has no administration in time range)  penicillin v potassium (VEETID) tablet 500 mg (has no administration in time range)     Initial Impression / Assessment and Plan / ED Course  I have reviewed the triage vital signs and the nursing notes.  Pertinent labs & imaging results that were available during my care of the patient  were reviewed by me and considered in my medical decision making (see chart for details).    Maudry Diegoatrick M Kozma is a 32 y.o. male who presents to ED for dental pain. No abscess requiring immediate incision and drainage. Patient is afebrile, non toxic appearing, and swallowing secretions well. Exam not concerning for Ludwig's angina or pharyngeal abscess. Will treat with PenVK. Has a dentist appointment scheduled and I stressed the importance of dental follow up for ultimate management of dental pain. Patient voices understanding and is agreeable to plan.   Final Clinical Impressions(s) / ED Diagnoses   Final diagnoses:  Pain, dental    ED Discharge Orders         Ordered    penicillin v potassium (VEETID) 500 MG tablet  4 times daily     10/24/18 2324           Donyale Berthold, Chase PicketJaime Pilcher, PA-C 10/24/18 2329    Tegeler, Canary Brimhristopher J, MD 10/25/18 [phone]

## 2018-10-24 NOTE — ED Triage Notes (Signed)
Pt c/o right upper and lower dental pain-NAD-steady gait

## 2018-10-24 NOTE — Discharge Instructions (Signed)
You have a dental infection. It is very important that you get evaluated by a dentist as soon as possible. Take your full course of antibiotics. Read the instructions below.  Eat a soft or liquid diet and rinse your mouth out after meals with warm water. You should see a dentist or return here at once if you have increased swelling, increased pain or uncontrolled bleeding from the site of your injury.  SEEK MEDICAL CARE IF:  You have a fever >101 If you are unable to open your mouth New or worsening symptoms develop, any additional concerns.

## 2018-10-25 NOTE — ED Notes (Signed)
Nurse went to discharge patient and informed him that he is being given a dose of pain medication and his antibiotic and a prescription for an antibiotic. Pt advised to continue tylenol and ibuprofen at home until he sees a dentist. During discharge, pt's visitor asked if he was getting a prescription for pain medication, pt states that he is not here for pain medication, that he is here for an abx, and that "if I was here for pain, they've done a terrible job because I've told everyone that my pain is a 9 out of 10." Nurse informed pt that he is being given a dose of pain medication here, and that narcotics are not typically given in the ED for dental pain, but to continue to alternate tylenol and ibuprofen at home. Pt cut nurse off and stated that he has a dentist appointment in the morning, then continued to express his displeasure with the management of his pain. Nurse apologized and wished him a good visit at his dentist appointment in the morning, and thanked him for his visit in the ED.

## 2019-01-25 ENCOUNTER — Ambulatory Visit (HOSPITAL_COMMUNITY)
Admission: EM | Admit: 2019-01-25 | Discharge: 2019-01-25 | Discharge status: Against Medical Advice | Payer: Self-pay | Attending: Family Medicine | Admitting: Family Medicine

## 2019-07-07 ENCOUNTER — Other Ambulatory Visit: Payer: Self-pay

## 2019-07-07 ENCOUNTER — Emergency Department (HOSPITAL_COMMUNITY)
Admission: EM | Admit: 2019-07-07 | Discharge: 2019-07-07 | Disposition: A | Discharge status: Home / Self Care | Payer: Self-pay | Attending: Emergency Medicine | Admitting: Emergency Medicine

## 2019-07-07 ENCOUNTER — Emergency Department (HOSPITAL_COMMUNITY): Payer: Self-pay

## 2019-07-07 ENCOUNTER — Encounter (HOSPITAL_COMMUNITY): Payer: Self-pay | Admitting: *Deleted

## 2019-07-07 DIAGNOSIS — S27321A Contusion of lung, unilateral, initial encounter: Secondary | ICD-10-CM | POA: Insufficient documentation

## 2019-07-07 DIAGNOSIS — R042 Hemoptysis: Secondary | ICD-10-CM | POA: Insufficient documentation

## 2019-07-07 DIAGNOSIS — Y929 Unspecified place or not applicable: Secondary | ICD-10-CM | POA: Insufficient documentation

## 2019-07-07 DIAGNOSIS — J939 Pneumothorax, unspecified: Secondary | ICD-10-CM | POA: Insufficient documentation

## 2019-07-07 DIAGNOSIS — Y9389 Activity, other specified: Secondary | ICD-10-CM | POA: Insufficient documentation

## 2019-07-07 DIAGNOSIS — S2241XA Multiple fractures of ribs, right side, initial encounter for closed fracture: Secondary | ICD-10-CM | POA: Insufficient documentation

## 2019-07-07 DIAGNOSIS — R109 Unspecified abdominal pain: Secondary | ICD-10-CM | POA: Insufficient documentation

## 2019-07-07 DIAGNOSIS — Y999 Unspecified external cause status: Secondary | ICD-10-CM | POA: Insufficient documentation

## 2019-07-07 DIAGNOSIS — F17210 Nicotine dependence, cigarettes, uncomplicated: Secondary | ICD-10-CM | POA: Insufficient documentation

## 2019-07-07 LAB — CBC
HCT: 45.8 % (ref 39.0–52.0)
Hemoglobin: 15.3 g/dL (ref 13.0–17.0)
MCH: 31.4 pg (ref 26.0–34.0)
MCHC: 33.4 g/dL (ref 30.0–36.0)
MCV: 94 fL (ref 80.0–100.0)
Platelets: 185 10*3/uL (ref 150–400)
RBC: 4.87 MIL/uL (ref 4.22–5.81)
RDW: 12.8 % (ref 11.5–15.5)
WBC: 9.9 10*3/uL (ref 4.0–10.5)
nRBC: 0 % (ref 0.0–0.2)

## 2019-07-07 LAB — BASIC METABOLIC PANEL
Anion gap: 13 (ref 5–15)
BUN: 13 mg/dL (ref 6–20)
CO2: 21 mmol/L — ABNORMAL LOW (ref 22–32)
Calcium: 9 mg/dL (ref 8.9–10.3)
Chloride: 106 mmol/L (ref 98–111)
Creatinine, Ser: 1.18 mg/dL (ref 0.61–1.24)
GFR calc Af Amer: >60 mL/min (ref >60)
GFR calc non Af Amer: >60 mL/min (ref >60)
Glucose, Bld: 117 mg/dL — ABNORMAL HIGH (ref 70–99)
Potassium: 3.3 mmol/L — ABNORMAL LOW (ref 3.5–5.1)
Sodium: 140 mmol/L (ref 135–145)

## 2019-07-07 LAB — ETHANOL: Alcohol, Ethyl (B): 42 mg/dL — ABNORMAL HIGH (ref <10)

## 2019-07-07 MED ORDER — IOHEXOL 300 MG/ML  SOLN
100.0000 mL | Freq: Once | INTRAMUSCULAR | Status: AC | PRN
  Administered 2019-07-07: 03:00:00 100 mL via INTRAVENOUS

## 2019-07-07 MED ORDER — HYDROCODONE-ACETAMINOPHEN 5-325 MG PO TABS: 1.0000 | ORAL_TABLET | Freq: Four times a day (QID) | ORAL | 0 refills | Status: DC | PRN

## 2019-07-07 MED ORDER — SODIUM CHLORIDE 0.9 % IV BOLUS
1000.0000 mL | Freq: Once | INTRAVENOUS | Status: AC
  Administered 2019-07-07: 03:00:00 1000 mL via INTRAVENOUS
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
## 2019-07-07 NOTE — ED Notes (Signed)
Pt states wanting to leave at this time. Dr. Stark Jock informed

## 2019-07-07 NOTE — ED Notes (Signed)
Pt states having increased pain with breathing. RR 26 at this time. Pt placed on nasal cannula 2 L for comfort. Pt sitting at bedside
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
## 2019-07-07 NOTE — ED Notes (Signed)
Patient transported to CT 

## 2019-07-07 NOTE — ED Notes (Signed)
Discharge instructions discussed with pt and wife. Pt verbalized understanding no questions at this time.

## 2019-07-07 NOTE — Discharge Instructions (Addendum)
Hydrocodone as prescribed as needed for pain.  Ice for 20 minutes every 2 hours while awake for the next 2 days.  Return to the emergency department if you develop worsening pain, high fever, difficulty breathing, or other new and concerning symptoms.

## 2019-08-27 ENCOUNTER — Encounter: Payer: Self-pay | Admitting: Internal Medicine

## 2019-08-27 ENCOUNTER — Other Ambulatory Visit: Payer: Self-pay

## 2019-08-27 ENCOUNTER — Institutional Professional Consult (permissible substitution): Payer: Self-pay | Admitting: Internal Medicine

## 2019-08-27 ENCOUNTER — Ambulatory Visit (INDEPENDENT_AMBULATORY_CARE_PROVIDER_SITE_OTHER): Payer: Self-pay | Admitting: Internal Medicine

## 2019-08-27 ENCOUNTER — Ambulatory Visit (INDEPENDENT_AMBULATORY_CARE_PROVIDER_SITE_OTHER): Payer: Self-pay

## 2019-08-27 ENCOUNTER — Encounter: Payer: Self-pay | Admitting: *Deleted

## 2019-08-27 VITALS — BP 106/70 | HR 79 | Temp 97.6°F | Ht 73.0 in | Wt 196.6 lb

## 2019-08-27 DIAGNOSIS — R9389 Abnormal findings on diagnostic imaging of other specified body structures: Secondary | ICD-10-CM

## 2019-08-27 DIAGNOSIS — J90 Pleural effusion, not elsewhere classified: Secondary | ICD-10-CM

## 2019-08-27 MED ORDER — OXYCODONE-ACETAMINOPHEN 10-325 MG PO TABS: 1.0000 | ORAL_TABLET | Freq: Four times a day (QID) | ORAL | 0 refills | Status: DC | PRN

## 2019-08-27 NOTE — Patient Instructions (Signed)
-   We will figure out place to tap fluid - Switch to motrin 800mg  with meals (2400mg  daily), stop naproxen - Check into your work's short term disability forms, I am happy to fill out whatever needs to be filled out - Work on slow deep breathing

## 2019-08-27 NOTE — Progress Notes (Deleted)
Synopsis: Referred in *** for *** by No ref. provider found  Subjective:   PATIENT ID: Andrew Gardner GENDER: male DOB: 10-Oct-1986, MRN: [id number]  No chief complaint on file.   HPI ***  ROS Positive Symptoms in bold: *** Constitutional fevers, chills, weight loss, fatigue, anorexia, malaise  Eyes decreased vision, double vision, eye irritation  Ears, Nose, Mouth, Throat sore throat, trouble swallowing, sinus congestion  Cardiovascular chest pain, paroxysmal nocturnal dyspnea, lower ext edema, palpitations   Respiratory SOB, cough, DOE, hemoptysis, wheezing  Gastrointestinal nausea, vomiting, diarrhea  Genitourinary burning with urination, trouble urinating  Musculoskeletal joint aches, joint swelling, back pain  Integumentary  rashes, skin lesions  Neurological focal weakness, focal numbness, trouble speaking, headaches  Psychiatric depression, anxiety, confusion  Endocrine polyuria, polydipsia, cold intolerance, heat intolerance  Hematologic abnormal bruising, abnormal bleeding, unexplained nose bleeds  Allergic/Immunologic recurrent infections, hives, swollen lymph nodes    No past medical history on file.   No family history on file.   No past surgical history on file.  Social History   Socioeconomic History  . Marital status: Single    Spouse name: Not on file  . Number of children: Not on file  . Years of education: Not on file  . Highest education level: Not on file  Occupational History  . Not on file  Social Needs  . Financial resource strain: Not on file  . Food insecurity    Worry: Not on file    Inability: Not on file  . Transportation needs    Medical: Not on file    Non-medical: Not on file  Tobacco Use  . Smoking status: Current Every Day Smoker    Packs/day: 0.50    Types: Cigarettes  . Smokeless tobacco: Never Used  Substance and Sexual Activity  . Alcohol use: No    Frequency: Never  . Drug use: Yes    Types: Marijuana  . Sexual  activity: Not on file  Lifestyle  . Physical activity    Days per week: Not on file    Minutes per session: Not on file  . Stress: Not on file  Relationships  . Social Herbalist on phone: Not on file    Gets together: Not on file    Attends religious service: Not on file    Active member of club or organization: Not on file    Attends meetings of clubs or organizations: Not on file    Relationship status: Not on file  . Intimate partner violence    Fear of current or ex partner: Not on file    Emotionally abused: Not on file    Physically abused: Not on file    Forced sexual activity: Not on file  Other Topics Concern  . Not on file  Social History Narrative  . Not on file     No Known Allergies   Outpatient Medications Prior to Visit  Medication Sig Dispense Refill  . HYDROcodone-acetaminophen (NORCO) 5-325 MG tablet Take 1-2 tablets by mouth every 6 (six) hours as needed. 20 tablet 0  . naproxen (NAPROSYN) 500 MG tablet Take 1 tablet (500 mg total) by mouth 2 (two) times daily. 14 tablet 0  . promethazine (PHENERGAN) 25 MG tablet Take 1 tablet (25 mg total) by mouth every 6 (six) hours as needed for nausea or vomiting. 12 tablet 1   No facility-administered medications prior to visit.      Objective:  GEN: *** HEENT:  CV: PULM: GI: EXT: NEURO: PSYCH: SKIN:    There were no vitals filed for this visit.   on *** LPM *** RA BMI Readings from Last 3 Encounters:  07/07/19 29.43 kg/m  10/24/18 26.39 kg/m  10/03/17 29.69 kg/m   Wt Readings from Last 3 Encounters:  07/07/19 220 lb (99.8 kg)  10/24/18 200 lb (90.7 kg)  10/03/17 225 lb (102.1 kg)     CBC    Component Value Date/Time   WBC 9.9 07/07/2019 0211   RBC 4.87 07/07/2019 0211   HGB 15.3 07/07/2019 0211   HCT 45.8 07/07/2019 0211   PLT 185 07/07/2019 0211   MCV 94.0 07/07/2019 0211   MCH 31.4 07/07/2019 0211   MCHC 33.4 07/07/2019 0211   RDW 12.8 07/07/2019 0211   LYMPHSABS 1.7  10/03/2017 2032   MONOABS 0.6 10/03/2017 2032   EOSABS 0.1 10/03/2017 2032   BASOSABS 0.0 10/03/2017 2032    ***  Chest Imaging: ***  Pulmonary Functions Testing Results: No flowsheet data found.  FeNO: ***  Pathology: ***  Echocardiogram: ***  Heart Catheterization: ***    Assessment & Plan:     Discussion: ***   Current Outpatient Medications:  .  HYDROcodone-acetaminophen (NORCO) 5-325 MG tablet, Take 1-2 tablets by mouth every 6 (six) hours as needed., Disp: 20 tablet, Rfl: 0 .  naproxen (NAPROSYN) 500 MG tablet, Take 1 tablet (500 mg total) by mouth 2 (two) times daily., Disp: 14 tablet, Rfl: 0 .  promethazine (PHENERGAN) 25 MG tablet, Take 1 tablet (25 mg total) by mouth every 6 (six) hours as needed for nausea or vomiting., Disp: 12 tablet, Rfl: 1   Lorin Glass, MD [facility] 08/27/2019 9:03 AM
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
## 2019-08-28 ENCOUNTER — Other Ambulatory Visit (HOSPITAL_COMMUNITY)
Admission: RE | Admit: 2019-08-28 | Discharge: 2019-08-28 | Disposition: A | Discharge status: Home / Self Care | Payer: Self-pay | Source: Ambulatory Visit | Attending: Internal Medicine | Admitting: Internal Medicine

## 2019-08-28 DIAGNOSIS — Z20828 Contact with and (suspected) exposure to other viral communicable diseases: Secondary | ICD-10-CM | POA: Insufficient documentation

## 2019-08-28 DIAGNOSIS — Z01812 Encounter for preprocedural laboratory examination: Secondary | ICD-10-CM | POA: Insufficient documentation

## 2019-08-29 LAB — NOVEL CORONAVIRUS, NAA (HOSP ORDER, SEND-OUT TO REF LAB; TAT 18-24 HRS): SARS-CoV-2, NAA: NOT DETECTED

## 2019-08-31 ENCOUNTER — Ambulatory Visit (HOSPITAL_COMMUNITY)
Admission: RE | Admit: 2019-08-31 | Discharge: 2019-08-31 | Disposition: A | Discharge status: Home / Self Care | Payer: Self-pay | Source: Ambulatory Visit | Attending: Internal Medicine | Admitting: Internal Medicine

## 2019-08-31 ENCOUNTER — Other Ambulatory Visit: Payer: Self-pay

## 2019-08-31 ENCOUNTER — Ambulatory Visit (HOSPITAL_COMMUNITY)
Admission: RE | Admit: 2019-08-31 | Discharge: 2019-08-31 | Disposition: A | Discharge status: Home / Self Care | Payer: Self-pay | Source: Ambulatory Visit | Attending: Radiology | Admitting: Radiology

## 2019-08-31 ENCOUNTER — Telehealth: Payer: Self-pay | Admitting: Pulmonary Disease

## 2019-08-31 DIAGNOSIS — F17210 Nicotine dependence, cigarettes, uncomplicated: Secondary | ICD-10-CM | POA: Insufficient documentation

## 2019-08-31 DIAGNOSIS — J90 Pleural effusion, not elsewhere classified: Secondary | ICD-10-CM | POA: Insufficient documentation

## 2019-08-31 DIAGNOSIS — Z9889 Other specified postprocedural states: Secondary | ICD-10-CM | POA: Insufficient documentation

## 2019-08-31 MED ORDER — LIDOCAINE HCL 1 % IJ SOLN
INTRAMUSCULAR | Status: AC
  Filled 2019-08-31: qty 10

## 2019-08-31 NOTE — Telephone Encounter (Signed)
Spoke with Judson Roch. Advised her that the procedure was done for therapeutic reasons, I remembered Dr. Tamala Julian talking about this the other day while in the clinic. She verbalized understanding.   Nothing further needed at time of call.

## 2019-08-31 NOTE — Procedures (Signed)
Ultrasound-guided  therapeutic right thoracentesis performed yielding 1 liter of amber fluid. Follow-up chest x-ray pending. EBL none.  Patient did experience a mild vasovagal response near end of procedure which responded to cool compress to face and neck and p.o. fluids.

## 2019-09-03 ENCOUNTER — Other Ambulatory Visit: Payer: Self-pay

## 2019-09-03 ENCOUNTER — Ambulatory Visit (INDEPENDENT_AMBULATORY_CARE_PROVIDER_SITE_OTHER): Payer: Self-pay | Admitting: Pulmonary Disease

## 2019-09-03 ENCOUNTER — Encounter: Payer: Self-pay | Admitting: Pulmonary Disease

## 2019-09-03 DIAGNOSIS — Z720 Tobacco use: Secondary | ICD-10-CM | POA: Insufficient documentation

## 2019-09-03 DIAGNOSIS — J90 Pleural effusion, not elsewhere classified: Secondary | ICD-10-CM

## 2019-09-03 DIAGNOSIS — F17200 Nicotine dependence, unspecified, uncomplicated: Secondary | ICD-10-CM

## 2019-09-03 NOTE — Assessment & Plan Note (Signed)
Plan: We recommend that you stop smoking 

## 2019-09-03 NOTE — Patient Instructions (Addendum)
You were seen today by Lauraine Rinne, NP  for:   1. Pleural effusion  - DG Chest 2 View; Future  We will repeat a chest x-ray in 2 weeks, you do not need an appointment for this risk, and then complete over the first week of November  Contact our office if you start having worsening shortness of breath or increased chest pain  We will have attended follow-up for you to see Dr. Tamala Julian in 6 weeks, if symptoms are improving as well as chest x-ray in 2 weeks is cleared then likely this appointment could be canceled and just have follow-up as needed with our office  2. Smoker  Continuing to smoke can increase your chances of having pleural diseases as well as respiratory problems in the future.  We recommend that you stop smoking.  We recommend that you stop smoking.  >>>You need to set a quit date >>>If you have friends or family who smoke, let them know you are trying to quit and not to smoke around you or in your living environment  Smoking Cessation Resources:  1 800 QUIT NOW  >>> Patient to call this resource and utilize it to help support her quit smoking >>> Keep up your hard work with stopping smoking  You can also contact the [facility] >>>For smoking cessation classes call [phone]  We do not recommend using e-cigarettes as a form of stopping smoking  You can sign up for smoking cessation support texts and information:  >>>https://smokefree.gov/smokefreetxt    We recommend today:  Orders Placed This Encounter  Procedures  . DG Chest 2 View    Standing Status:   Future    Standing Expiration Date:   11/02/2020    Order Specific Question:   Reason for Exam (SYMPTOM  OR DIAGNOSIS REQUIRED)    Answer:   follow up plueral effusion    Order Specific Question:   Preferred imaging location?    Answer:   Internal    Order Specific Question:   Radiology Contrast Protocol - do NOT remove file path    Answer:    [email or web address]   Orders Placed This Encounter  Procedures  . DG Chest 2 View   No orders of the defined types were placed in this encounter.   Follow Up:    Return in about 6 weeks (around 10/15/2019), or if symptoms worsen or fail to improve, for Follow up with Dr. Tamala Julian.   Please do your part to reduce the spread of COVID-19:      Reduce your risk of any infection  and COVID19 by using the similar precautions used for avoiding the common cold or flu:  Marland Kitchen Wash your hands often with soap and warm water for at least 20 seconds.  If soap and water are not readily available, use an alcohol-based hand sanitizer with at least 60% alcohol.  . If coughing or sneezing, cover your mouth and nose by coughing or sneezing into the elbow areas of your shirt or coat, into a tissue or into your sleeve (not your hands). Langley Gauss A MASK when in public  . Avoid shaking hands with others and consider head nods or verbal greetings only. . Avoid touching your eyes, nose, or mouth with unwashed hands.  . Avoid close contact with people who are sick. . Avoid places or events with large numbers of people in one location, like concerts or sporting events. . If you have some symptoms but not all symptoms,  continue to monitor at home and seek medical attention if your symptoms worsen. . If you are having a medical emergency, call 911.   ADDITIONAL HEALTHCARE OPTIONS FOR PATIENTS  [facility] / e-Visit: [email or web address]         [facility]: [phone]  [facility]: [phone]                   [facility]: [phone]     It is flu season:   >>> Best ways to protect herself from the flu: Receive the yearly flu vaccine, practice good hand hygiene washing with soap and also using hand sanitizer when available, eat a nutritious meals, get adequate rest, hydrate  appropriately   Please contact the office if your symptoms worsen or you have concerns that you are not improving.   Thank you for choosing [facility] for your healthcare, and for allowing [place] to partner with you on your healthcare journey. I am thankful to be able to provide care to you today.   Elisha Headland FNP-C
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
## 2019-09-03 NOTE — Assessment & Plan Note (Signed)
Plan:  Chest Xray in 2 weeks  Continue to use Mobic  Continue to use NSAIDs  Can add tylenol 500mg  BID daily to help with pain  Can use percocet if needed Follow up with our office in 6 weeks with Dr. Tamala Julian, can cancel appt if symptoms continue to improve and cxray clear.

## 2019-09-13 ENCOUNTER — Ambulatory Visit (INDEPENDENT_AMBULATORY_CARE_PROVIDER_SITE_OTHER): Payer: Self-pay | Admitting: Pulmonary Disease

## 2019-09-13 ENCOUNTER — Encounter: Payer: Self-pay | Admitting: Pulmonary Disease

## 2019-09-13 ENCOUNTER — Ambulatory Visit (INDEPENDENT_AMBULATORY_CARE_PROVIDER_SITE_OTHER): Payer: Self-pay

## 2019-09-13 ENCOUNTER — Other Ambulatory Visit: Payer: Self-pay

## 2019-09-13 VITALS — BP 104/72 | HR 72 | Temp 98.2°F | Ht 73.0 in | Wt 196.2 lb

## 2019-09-13 DIAGNOSIS — F17210 Nicotine dependence, cigarettes, uncomplicated: Secondary | ICD-10-CM

## 2019-09-13 DIAGNOSIS — F17200 Nicotine dependence, unspecified, uncomplicated: Secondary | ICD-10-CM

## 2019-09-13 DIAGNOSIS — J90 Pleural effusion, not elsewhere classified: Secondary | ICD-10-CM
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
## 2019-09-13 NOTE — Assessment & Plan Note (Addendum)
Plan: Chest x-ray today Walk in office today Continue to use Mobic Can use NSAIDs and/or Tylenol for management of pain We will not refill Percocet today Follow-up in 6 months and as needed Okay to return to work and advance exercise as tolerated

## 2019-09-13 NOTE — Patient Instructions (Addendum)
You were seen today by Coral Ceo, NP  for:   1. Pleural effusion  Walk today   - DG Chest 2 View; Future  Okay to return to work.  Work note provided today.  2. Smoker  We recommend that you stop smoking.  >>>You need to set a quit date >>>If you have friends or family who smoke, let them know you are trying to quit and not to smoke around you or in your living environment  Smoking Cessation Resources:  1 800 QUIT NOW  >>> Patient to call this resource and utilize it to help support her quit smoking >>> Keep up your hard work with stopping smoking  You can also contact the [facility] >>>For smoking cessation classes call [phone]  We do not recommend using e-cigarettes as a form of stopping smoking  You can sign up for smoking cessation support texts and information:  >>>https://smokefree.gov/smokefreetxt     We recommend today:  Orders Placed This Encounter  Procedures   DG Chest 2 View    Standing Status:   Future    Standing Expiration Date:   11/12/2020    Order Specific Question:   Reason for Exam (SYMPTOM  OR DIAGNOSIS REQUIRED)    Answer:   history of pleural effusion    Order Specific Question:   Preferred imaging location?    Answer:   Internal    Order Specific Question:   Radiology Contrast Protocol - do NOT remove file path    Answer:   [email or web address]   Orders Placed This Encounter  Procedures   DG Chest 2 View   No orders of the defined types were placed in this encounter.   Follow Up:    Return in about 1 year (around 09/12/2020), or if symptoms worsen or fail to improve, for Follow up with Dr. Katrinka Blazing.   Please do your part to reduce the spread of COVID-19:      Reduce your risk of any infection  and COVID19 by using the similar precautions used for avoiding the common cold or flu:   Wash your hands often with soap and warm water for at least 20 seconds.  If soap and water  are not readily available, use an alcohol-based hand sanitizer with at least 60% alcohol.   If coughing or sneezing, cover your mouth and nose by coughing or sneezing into the elbow areas of your shirt or coat, into a tissue or into your sleeve (not your hands).  WEAR A MASK when in public   Avoid shaking hands with others and consider head nods or verbal greetings only.  Avoid touching your eyes, nose, or mouth with unwashed hands.   Avoid close contact with people who are sick.  Avoid places or events with large numbers of people in one location, like concerts or sporting events.  If you have some symptoms but not all symptoms, continue to monitor at home and seek medical attention if your symptoms worsen.  If you are having a medical emergency, call 911.   ADDITIONAL HEALTHCARE OPTIONS FOR PATIENTS   Telehealth / e-Visit: [email or web address]         [facility]: [phone]  [facility]: [phone]                   [facility]: [phone]     It is flu season:   >>> Best ways to protect herself from the flu: Receive the  yearly flu vaccine, practice good hand hygiene washing with soap and also using hand sanitizer when available, eat a nutritious meals, get adequate rest, hydrate appropriately   Please contact the office if your symptoms worsen or you have concerns that you are not improving.   Thank you for choosing [facility] for your healthcare, and for allowing [place] to partner with you on your healthcare journey. I am thankful to be able to provide care to you today.   Wyn Quaker FNP-C      Health Risks of Smoking Smoking cigarettes is very bad for your health. Tobacco smoke has over 200 known poisons in it. It contains the poisonous gases nitrogen oxide and carbon monoxide. There are over 60 chemicals in tobacco smoke that cause cancer. Smoking is difficult  to quit because a chemical in tobacco, called nicotine, causes addiction or dependence. When you smoke and inhale, nicotine is absorbed rapidly into the bloodstream through your lungs. Both inhaled and non-inhaled nicotine may be addictive. What are the risks of cigarette smoke? Cigarette smokers have an increased risk of many serious medical problems, including:  Lung cancer.  Lung disease, such as pneumonia, bronchitis, and emphysema.  Chest pain (angina) and heart attack because the heart is not getting enough oxygen.  Heart disease and peripheral blood vessel disease.  High blood pressure (hypertension).  Stroke.  Oral cancer, including cancer of the lip, mouth, or voice box.  Bladder cancer.  Pancreatic cancer.  Cervical cancer.  Pregnancy complications, including premature birth.  Stillbirths and smaller newborn babies, birth defects, and genetic damage to sperm.  Early menopause.  Lower estrogen level for women.  Infertility.  Facial wrinkles.  Blindness.  Increased risk of broken bones (fractures).  Senile dementia.  Stomach ulcers and internal bleeding.  Delayed wound healing and increased risk of complications during surgery.  Even smoking lightly shortens your life expectancy by several years. Because of secondhand smoke exposure, children of smokers have an increased risk of the following:  Sudden infant death syndrome (SIDS).  Respiratory infections.  Lung cancer.  Heart disease.  Ear infections. What are the benefits of quitting? There are many health benefits of quitting smoking. Here are some of them:  Within days of quitting smoking, your risk of having a heart attack decreases, your blood flow improves, and your lung capacity improves. Blood pressure, pulse rate, and breathing patterns start returning to normal soon after quitting.  Within months, your lungs may clear up completely.  Quitting for 10 years reduces your risk of  developing lung cancer and heart disease to almost that of a nonsmoker.  People who quit may see an improvement in their overall quality of life. How do I quit smoking?     Smoking is an addiction with both physical and psychological effects, and longtime habits can be hard to change. Your health care provider can recommend:  Programs and community resources, which may include group support, education, or talk therapy.  Prescription medicines to help reduce cravings.  Nicotine replacement products, such as patches, gum, and nasal sprays. Use these products only as directed. Do not replace cigarette smoking with electronic cigarettes, which are commonly called e-cigarettes. The safety of e-cigarettes is not known, and some may contain harmful chemicals.  A combination of two or more of these methods. Where to find more information  American Lung Association: www.lung.org  American Cancer Society: www.cancer.org Summary  Smoking cigarettes is very bad for your health. Cigarette smokers have an increased risk  of many serious medical problems, including several cancers, heart disease, and stroke.  Smoking is an addiction with both physical and psychological effects, and longtime habits can be hard to change.  By stopping right away, you can greatly reduce the risk of medical problems for you and your family.  To help you quit smoking, your health care provider can recommend programs, community resources, prescription medicines, and nicotine replacement products such as patches, gum, and nasal sprays. This information is not intended to replace advice given to you by your health care provider. Make sure you discuss any questions you have with your health care provider. Document Released: 12/09/2004 Document Revised: 02/02/2018 Document Reviewed: 11/05/2016 Elsevier Patient Education  2020 [employer]

## 2019-09-13 NOTE — Progress Notes (Signed)
Small residual pleural effusion.  This is significantly decreased from chest x-ray prior to thoracentesis.  It has slightly decreased from the last exam on 08/31/2019 after thoracentesis.  We will continue to monitor this.  If you have worsened shortness of breath or chest pain please contact our office and schedule a follow-up visit.  Wyn Quaker, FNP

## 2019-09-13 NOTE — Assessment & Plan Note (Signed)
Plan: Emphasized need to stop smoking today Patient declined Patient declined flu vaccine

## 2019-09-17 NOTE — Progress Notes (Signed)
See previous note

## 2019-09-18 ENCOUNTER — Telehealth: Payer: Self-pay | Admitting: Internal Medicine

## 2019-09-18 ENCOUNTER — Encounter: Payer: Self-pay | Admitting: *Deleted

## 2019-09-18 NOTE — Telephone Encounter (Signed)
Notes recorded by Lauraine Rinne, NP on 09/13/2019 at 8:52 PM EDT  Small residual pleural effusion. This is significantly decreased from chest x-ray prior to thoracentesis. It has slightly decreased from the last exam on 08/31/2019 after thoracentesis.   We will continue to monitor this.   If you have worsened shortness of breath or chest pain please contact our office and schedule a follow-up visit.   Wyn Quaker, FNP ----------------------------  Spoke with pt, aware of results/recs.  Nothing further needed at this time- will close encounter.

## 2020-05-19 ENCOUNTER — Emergency Department (HOSPITAL_BASED_OUTPATIENT_CLINIC_OR_DEPARTMENT_OTHER): Payer: Self-pay

## 2020-05-19 ENCOUNTER — Other Ambulatory Visit: Payer: Self-pay

## 2020-05-19 ENCOUNTER — Encounter (HOSPITAL_BASED_OUTPATIENT_CLINIC_OR_DEPARTMENT_OTHER): Payer: Self-pay

## 2020-05-19 ENCOUNTER — Emergency Department (HOSPITAL_BASED_OUTPATIENT_CLINIC_OR_DEPARTMENT_OTHER)
Admission: EM | Admit: 2020-05-19 | Discharge: 2020-05-19 | Disposition: A | Discharge status: Home / Self Care | Payer: Self-pay | Attending: Emergency Medicine | Admitting: Emergency Medicine

## 2020-05-19 DIAGNOSIS — R350 Frequency of micturition: Secondary | ICD-10-CM | POA: Insufficient documentation

## 2020-05-19 DIAGNOSIS — N433 Hydrocele, unspecified: Secondary | ICD-10-CM | POA: Insufficient documentation

## 2020-05-19 DIAGNOSIS — N341 Nonspecific urethritis: Secondary | ICD-10-CM | POA: Insufficient documentation

## 2020-05-19 DIAGNOSIS — N342 Other urethritis: Secondary | ICD-10-CM

## 2020-05-19 DIAGNOSIS — F17210 Nicotine dependence, cigarettes, uncomplicated: Secondary | ICD-10-CM | POA: Insufficient documentation

## 2020-05-19 HISTORY — DX: Unspecified injury of lung, unspecified, initial encounter: S27.309A

## 2020-05-19 LAB — URINALYSIS, ROUTINE W REFLEX MICROSCOPIC
Bilirubin Urine: NEGATIVE
Glucose, UA: NEGATIVE mg/dL
Hgb urine dipstick: NEGATIVE
Ketones, ur: NEGATIVE mg/dL
Leukocytes,Ua: NEGATIVE
Nitrite: NEGATIVE
Protein, ur: NEGATIVE mg/dL
Specific Gravity, Urine: >1.03 — ABNORMAL HIGH (ref 1.005–1.030)
pH: 6 (ref 5.0–8.0)

## 2020-05-19 LAB — HIV ANTIBODY (ROUTINE TESTING W REFLEX): HIV Screen 4th Generation wRfx: NONREACTIVE

## 2020-05-19 MED ORDER — DOXYCYCLINE HYCLATE 100 MG PO CAPS: 100.0000 mg | ORAL_CAPSULE | Freq: Two times a day (BID) | ORAL | 0 refills | Status: DC

## 2020-05-19 MED ORDER — CEFTRIAXONE SODIUM 500 MG IJ SOLR
500.0000 mg | Freq: Once | INTRAMUSCULAR | Status: AC
  Administered 2020-05-19: 500 mg via INTRAMUSCULAR
  Filled 2020-05-19: qty 500

## 2020-05-19 MED FILL — DOXYCYCLINE HYCLATE 100 MG: 100 | 10 days supply | Qty: 20 | Fill #0

## 2020-05-19 NOTE — ED Provider Notes (Signed)
[facility] EMERGENCY DEPARTMENT Provider Note   CSN: [id number] Arrival date & time: 05/19/20  1320     History Chief Complaint  Patient presents with  . Urinary Frequency    Andrew Gardner is a 34 y.o. male.  Patient is a 34 year old male who presents with testicular pain and urinary frequency.  He has had the symptoms for about 2 days.  Feels like he has had a little bit of discharge from his penis.  His testicles are sore.  No known injuries.  No known history of STDs.  He is sexually active with his wife but denies any other known contacts.  No abdominal pain.  No fevers.  No vomiting.        Past Medical History:  Diagnosis Date  . Lung injury     Patient Active Problem List   Diagnosis Date Noted  . Pleural effusion 09/03/2019  . Smoker 09/03/2019    History reviewed. No pertinent surgical history.     No family history on file.  Social History   Tobacco Use  . Smoking status: Current Every Day Smoker    Packs/day: 1.00    Years: 6.00    Pack years: 6.00    Types: Cigarettes  . Smokeless tobacco: Never Used  Vaping Use  . Vaping Use: Never used  Substance Use Topics  . Alcohol use: No  . Drug use: Yes    Types: Marijuana    Home Medications Prior to Admission medications   Medication Sig Start Date End Date Taking? Authorizing Provider  doxycycline (VIBRAMYCIN) 100 MG capsule Take 1 capsule (100 mg total) by mouth 2 (two) times daily. One po bid x 7 days 05/19/20   Rolan Bucco, MD  HYDROcodone-acetaminophen (NORCO) 5-325 MG tablet Take 1-2 tablets by mouth every 6 (six) hours as needed. 07/07/19   Geoffery Lyons, MD  meloxicam (MOBIC) 15 MG tablet Take 1 tablet by mouth daily. 07/30/19   [provider]  tiZANidine (ZANAFLEX) 4 MG tablet Take 4 mg by mouth 3 (three) times daily as needed. 04/02/19   [provider]    Allergies    Patient has no known allergies.  Review of Systems   Review of Systems    Constitutional: Negative for chills, diaphoresis, fatigue and fever.  HENT: Negative for congestion, rhinorrhea and sneezing.   Eyes: Negative.   Respiratory: Negative for cough, chest tightness and shortness of breath.   Cardiovascular: Negative for chest pain and leg swelling.  Gastrointestinal: Negative for abdominal pain, blood in stool, diarrhea, nausea and vomiting.  Genitourinary: Positive for discharge, frequency, scrotal swelling and testicular pain. Negative for difficulty urinating, flank pain and hematuria.  Musculoskeletal: Negative for arthralgias and back pain.  Skin: Negative for rash.  Neurological: Negative for dizziness, speech difficulty, weakness, numbness and headaches.    Physical Exam Updated Vital Signs BP 131/77 (BP Location: Left Arm)   Pulse 67   Temp 98.3 F (36.8 C) (Oral)   Resp 16   Ht 6\' 1"  (1.854 m)   Wt 91 kg   SpO2 99%   BMI 26.47 kg/m   Physical Exam Constitutional:      Appearance: He is well-developed.  HENT:     Head: Normocephalic and atraumatic.  Eyes:     Pupils: Pupils are equal, round, and reactive to light.  Cardiovascular:     Rate and Rhythm: Normal rate and regular rhythm.     Heart sounds: Normal heart sounds.  Pulmonary:  Effort: Pulmonary effort is normal. No respiratory distress.     Breath sounds: Normal breath sounds. No wheezing or rales.  Chest:     Chest wall: No tenderness.  Abdominal:     General: Bowel sounds are normal.     Palpations: Abdomen is soft.     Tenderness: There is no abdominal tenderness. There is no guarding or rebound.  Genitourinary:    Comments: No discharge is noted.  He has normal appearing circumcised genitalia.  There are some mild tenderness on palpation of the left testicle and minimal tenderness on palpation of the right testicle.  No significant pain over the epididymis.  No abnormal lie. Musculoskeletal:        General: Normal range of motion.     Cervical back: Normal range of  motion and neck supple.  Lymphadenopathy:     Cervical: No cervical adenopathy.  Skin:    General: Skin is warm and dry.     Findings: No rash.  Neurological:     Mental Status: He is alert and oriented to person, place, and time.     ED Results / Procedures / Treatments   Labs (all labs ordered are listed, but only abnormal results are displayed) Labs Reviewed  URINALYSIS, ROUTINE W REFLEX MICROSCOPIC - Abnormal; Notable for the following components:      Result Value   Specific Gravity, Urine >1.030 (*)    All other components within normal limits  RPR  HIV ANTIBODY (ROUTINE TESTING W REFLEX)  GC/CHLAMYDIA PROBE AMP ([employer]) NOT AT [facility]    EKG None  Radiology US Scrotum  Result Date: 05/19/2020 CLINICAL DATA:  Testicular swelling x2 days. EXAM: SCROTAL ULTRASOUND DOPPLER ULTRASOUND OF THE TESTICLES TECHNIQUE: Complete ultrasound examination of the testicles, epididymis, and other scrotal structures was performed. Color and spectral Doppler ultrasound were also utilized to evaluate blood flow to the testicles. COMPARISON:  None. FINDINGS: Right testicle Measurements: 4.3 cm x 4.1 cm x 3.1 cm. No mass or microlithiasis visualized. Left testicle Measurements: 4.5 cm x 3.2 cm x 2.8 cm. No mass or microlithiasis visualized. Right epididymis:  Normal in size and appearance. Left epididymis:  Normal in size and appearance. Hydrocele: There is a small to moderate sized right-sided hydrocele. Varicocele:  A left-sided varicocele is seen. Pulsed Doppler interrogation of both testes demonstrates normal low resistance arterial and venous waveforms bilaterally. IMPRESSION: 1. Small to moderate sized right-sided hydrocele. 2. Left-sided varicocele. Electronically Signed   By: Aram Candela M.D.   On: 05/19/2020 16:14    Procedures Procedures (including critical care time)  Medications Ordered in ED Medications  cefTRIAXone (ROCEPHIN) injection 500 mg (500 mg Intramuscular Given  05/19/20 1654)    ED Course  I have reviewed the triage vital signs and the nursing notes.  Pertinent labs & imaging results that were available during my care of the patient were reviewed by me and considered in my medical decision making (see chart for details).    MDM Rules/Calculators/A&P                          Patient is a 34 year old male who presents with some testicular soreness and discharge from his penis.  Scrotal ultrasound shows no evidence of torsion.  No definitive epididymitis.  There is a left hydrocele and a right varicocele present.  His urinalysis shows no suggestions of infection.  He was treated presumptively for STDs with Rocephin and doxycycline.  He was  given a referral to follow-up with urology.  Return precautions were given.  He was counseled on his follow-up of his STD testing. Final Clinical Impression(s) / ED Diagnoses Final diagnoses:  Urethritis  Hydrocele of testis    Rx / DC Orders ED Discharge Orders         Ordered    doxycycline (VIBRAMYCIN) 100 MG capsule  2 times daily     Discontinue  Reprint     05/19/20 1703           Rolan Bucco, MD 05/19/20 1705

## 2020-05-19 NOTE — ED Triage Notes (Addendum)
Pt c/o testicle swelling and urinary freq x 2 days-pt states his wife with similar sx of urinary freq x 1 month-NAD-steady gait

## 2020-05-20 LAB — GC/CHLAMYDIA PROBE AMP (~~LOC~~) NOT AT ARMC
Chlamydia: NEGATIVE
Comment: NEGATIVE
Comment: NORMAL
Neisseria Gonorrhea: NEGATIVE

## 2020-05-20 LAB — RPR: RPR Ser Ql: NONREACTIVE

## 2021-05-07 ENCOUNTER — Encounter: Payer: Self-pay | Admitting: Internal Medicine

## 2021-05-07 ENCOUNTER — Ambulatory Visit (INDEPENDENT_AMBULATORY_CARE_PROVIDER_SITE_OTHER): Payer: PRIVATE HEALTH INSURANCE | Admitting: Internal Medicine

## 2021-05-07 ENCOUNTER — Ambulatory Visit (INDEPENDENT_AMBULATORY_CARE_PROVIDER_SITE_OTHER): Payer: PRIVATE HEALTH INSURANCE

## 2021-05-07 ENCOUNTER — Encounter: Payer: Self-pay | Admitting: *Deleted

## 2021-05-07 ENCOUNTER — Other Ambulatory Visit: Payer: Self-pay

## 2021-05-07 VITALS — BP 120/80 | HR 79 | Temp 98.4°F | Ht 73.5 in | Wt 189.4 lb

## 2021-05-07 DIAGNOSIS — Z720 Tobacco use: Secondary | ICD-10-CM

## 2021-05-07 DIAGNOSIS — R0789 Other chest pain: Secondary | ICD-10-CM

## 2021-05-07 DIAGNOSIS — R0781 Pleurodynia: Secondary | ICD-10-CM
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
## 2021-05-07 NOTE — Assessment & Plan Note (Addendum)
Likely re-injured area of previous trauma while lifting AC unit. Complaint of "no energy"likely due to some dyspnea and anxiety (he didn't like the 2020 thoracentesis), but will check labs.  Plan- CXR/ rib details, CBC and chemistry. Suggested NSAIDs, pillow to brace, local heat.

## 2021-05-07 NOTE — Patient Instructions (Signed)
Order- CXR with R rib details       dx R lateral chest wall pain  Order- lab CBC w diff, CMET      R lateral chest wall pain  Ok to take otc NSAID like Advil or Motrin. Heating pad may help. Holding a pillow under your arm may helpi if you need to cough  Please let us know if you don't gradually get better.

## 2021-05-07 NOTE — Assessment & Plan Note (Signed)
Reports quitting 3 weeks ago. Strongly emphasized importance of staying off permanently.

## 2021-05-08 ENCOUNTER — Encounter: Payer: Self-pay | Admitting: *Deleted

## 2021-05-18 ENCOUNTER — Ambulatory Visit: Payer: Self-pay

## 2021-05-19 ENCOUNTER — Ambulatory Visit: Payer: PRIVATE HEALTH INSURANCE

## 2021-05-20 ENCOUNTER — Telehealth: Payer: Self-pay | Admitting: Internal Medicine

## 2021-05-20 NOTE — Telephone Encounter (Signed)
Called and spoke to pt. Pt states he did not send a message through MyChart and does not need an appt. Pt states he will look into this and call back if there are any issues. Nothing further needed at this time.

## 2021-07-01 ENCOUNTER — Other Ambulatory Visit: Payer: Self-pay

## 2021-07-01 ENCOUNTER — Emergency Department (HOSPITAL_COMMUNITY)
Admission: EM | Admit: 2021-07-01 | Discharge: 2021-07-01 | Discharge status: Against Medical Advice | Payer: Medicaid Other | Attending: Emergency Medicine | Admitting: Emergency Medicine

## 2021-07-01 ENCOUNTER — Emergency Department (HOSPITAL_COMMUNITY): Payer: Medicaid Other

## 2021-07-01 DIAGNOSIS — Z5321 Procedure and treatment not carried out due to patient leaving prior to being seen by health care provider: Secondary | ICD-10-CM | POA: Insufficient documentation

## 2021-07-01 DIAGNOSIS — H538 Other visual disturbances: Secondary | ICD-10-CM | POA: Insufficient documentation

## 2021-07-01 DIAGNOSIS — Y92410 Unspecified street and highway as the place of occurrence of the external cause: Secondary | ICD-10-CM | POA: Insufficient documentation

## 2021-07-01 DIAGNOSIS — R519 Headache, unspecified: Secondary | ICD-10-CM | POA: Diagnosis not present

## 2021-07-01 DIAGNOSIS — R0602 Shortness of breath: Secondary | ICD-10-CM | POA: Diagnosis not present

## 2021-07-01 NOTE — ED Notes (Signed)
Pt seen leaving an hour ago and hasnt returned
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
## 2021-07-28 ENCOUNTER — Other Ambulatory Visit: Payer: Self-pay

## 2021-07-28 ENCOUNTER — Encounter (HOSPITAL_BASED_OUTPATIENT_CLINIC_OR_DEPARTMENT_OTHER): Payer: Self-pay

## 2021-07-28 ENCOUNTER — Emergency Department (HOSPITAL_BASED_OUTPATIENT_CLINIC_OR_DEPARTMENT_OTHER): Payer: Medicaid Other

## 2021-07-28 ENCOUNTER — Emergency Department (HOSPITAL_BASED_OUTPATIENT_CLINIC_OR_DEPARTMENT_OTHER)
Admission: EM | Admit: 2021-07-28 | Discharge: 2021-07-29 | Disposition: A | Discharge status: Home / Self Care | Payer: Medicaid Other | Attending: Emergency Medicine | Admitting: Emergency Medicine

## 2021-07-28 DIAGNOSIS — M545 Low back pain, unspecified: Secondary | ICD-10-CM

## 2021-07-28 DIAGNOSIS — M546 Pain in thoracic spine: Secondary | ICD-10-CM | POA: Insufficient documentation

## 2021-07-28 DIAGNOSIS — R0789 Other chest pain: Secondary | ICD-10-CM | POA: Insufficient documentation

## 2021-07-28 DIAGNOSIS — M5459 Other low back pain: Secondary | ICD-10-CM | POA: Diagnosis not present

## 2021-07-28 DIAGNOSIS — F17210 Nicotine dependence, cigarettes, uncomplicated: Secondary | ICD-10-CM | POA: Diagnosis not present

## 2021-07-28 DIAGNOSIS — M47816 Spondylosis without myelopathy or radiculopathy, lumbar region: Secondary | ICD-10-CM

## 2021-07-28 LAB — BASIC METABOLIC PANEL
Anion gap: 6 (ref 5–15)
BUN: 15 mg/dL (ref 6–20)
CO2: 28 mmol/L (ref 22–32)
Calcium: 9 mg/dL (ref 8.9–10.3)
Chloride: 103 mmol/L (ref 98–111)
Creatinine, Ser: 1.02 mg/dL (ref 0.61–1.24)
GFR, Estimated: >60 mL/min (ref >60)
Glucose, Bld: 98 mg/dL (ref 70–99)
Potassium: 4 mmol/L (ref 3.5–5.1)
Sodium: 137 mmol/L (ref 135–145)

## 2021-07-28 LAB — CBC
HCT: 45.3 % (ref 39.0–52.0)
Hemoglobin: 15.2 g/dL (ref 13.0–17.0)
MCH: 30.5 pg (ref 26.0–34.0)
MCHC: 33.6 g/dL (ref 30.0–36.0)
MCV: 90.8 fL (ref 80.0–100.0)
Platelets: 159 10*3/uL (ref 150–400)
RBC: 4.99 MIL/uL (ref 4.22–5.81)
RDW: 12.8 % (ref 11.5–15.5)
WBC: 6.8 10*3/uL (ref 4.0–10.5)
nRBC: 0 % (ref 0.0–0.2)

## 2021-07-28 LAB — TROPONIN I (HIGH SENSITIVITY): Troponin I (High Sensitivity): 2 ng/L (ref <18)

## 2021-07-28 MED ORDER — LIDOCAINE 5 % EX PTCH
3.0000 | MEDICATED_PATCH | CUTANEOUS | Status: DC
  Administered 2021-07-28: 3 via TRANSDERMAL
  Filled 2021-07-28: qty 3

## 2021-07-28 MED ORDER — KETOROLAC TROMETHAMINE 60 MG/2ML IM SOLN
60.0000 mg | Freq: Once | INTRAMUSCULAR | Status: AC
  Administered 2021-07-28: 60 mg via INTRAMUSCULAR
  Filled 2021-07-28: qty 2
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
## 2021-07-28 NOTE — ED Triage Notes (Signed)
Pt is now reporting chest pain and SOB x 2 weeks.

## 2021-07-29 MED ORDER — MELOXICAM 15 MG PO TABS: 15.0000 mg | ORAL_TABLET | Freq: Every day | ORAL | 0 refills | Status: AC

## 2021-07-29 MED ORDER — METAXALONE 800 MG PO TABS: 800.0000 mg | ORAL_TABLET | Freq: Three times a day (TID) | ORAL | 0 refills | Status: AC
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
